# Patient Record
Sex: Male | Born: 1995 | Race: White | Hispanic: No | Marital: Single | Smoking: Never smoker
Health system: Southern US, Community
[De-identification: ages and names within clinical notes are randomized; demographics above are authoritative.]

## PROBLEM LIST (undated history)

## (undated) DIAGNOSIS — Z789 Other specified health status: Secondary | ICD-10-CM

## (undated) HISTORY — PX: OTHER SURGICAL HISTORY: SHX169

---
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
## 2017-08-28 MED FILL — AMOX-CLAV 500-125 MG TABLET: 500-125 | 7 days supply | Qty: 14 | Fill #0

## 2018-12-22 ENCOUNTER — Other Ambulatory Visit: Payer: Self-pay

## 2018-12-22 DIAGNOSIS — Z20822 Contact with and (suspected) exposure to covid-19: Secondary | ICD-10-CM

## 2018-12-26 LAB — NOVEL CORONAVIRUS, NAA: SARS-CoV-2, NAA: NOT DETECTED

## 2021-06-18 ENCOUNTER — Telehealth: Payer: BC Managed Care – PPO | Admitting: Family Medicine

## 2021-06-18 DIAGNOSIS — J069 Acute upper respiratory infection, unspecified: Secondary | ICD-10-CM

## 2021-06-18 NOTE — Patient Instructions (Signed)
Please restest for COVID to make sure you did not test to early- sometimes we see that even if you are feeling bad.  You have a virus of some type which sounds like it could also be the flu. I am glad you are feeling better and hope you continue to.  Rest, hydrate  Work note in chart
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
